# Patient Record
Sex: Female | Born: 1980 | Hispanic: No | Smoking: Never smoker
Health system: Southern US, Community
[De-identification: ages and names within clinical notes are randomized; demographics above are authoritative.]

---

## 2002-06-20 ENCOUNTER — Other Ambulatory Visit: Admission: RE | Admit: 2002-06-20 | Discharge: 2002-06-20 | Payer: Self-pay | Admitting: *Deleted

## 2003-06-21 ENCOUNTER — Other Ambulatory Visit: Admission: RE | Admit: 2003-06-21 | Discharge: 2003-06-21 | Payer: Self-pay | Admitting: *Deleted

## 2004-07-04 ENCOUNTER — Other Ambulatory Visit: Admission: RE | Admit: 2004-07-04 | Discharge: 2004-07-04 | Payer: Self-pay | Admitting: *Deleted

## 2018-01-23 ENCOUNTER — Emergency Department
Admission: EM | Admit: 2018-01-23 | Discharge: 2018-01-23 | Disposition: A | Discharge status: Home / Self Care | Payer: Self-pay | Source: Home / Self Care

## 2018-01-23 ENCOUNTER — Encounter: Payer: Self-pay | Admitting: Family Medicine

## 2018-01-23 ENCOUNTER — Other Ambulatory Visit: Payer: Self-pay

## 2018-01-23 ENCOUNTER — Emergency Department (INDEPENDENT_AMBULATORY_CARE_PROVIDER_SITE_OTHER): Payer: Self-pay

## 2018-01-23 DIAGNOSIS — J0100 Acute maxillary sinusitis, unspecified: Secondary | ICD-10-CM

## 2018-01-23 DIAGNOSIS — J101 Influenza due to other identified influenza virus with other respiratory manifestations: Secondary | ICD-10-CM

## 2018-01-23 DIAGNOSIS — R062 Wheezing: Secondary | ICD-10-CM

## 2018-01-23 DIAGNOSIS — R0602 Shortness of breath: Secondary | ICD-10-CM

## 2018-01-23 DIAGNOSIS — J4541 Moderate persistent asthma with (acute) exacerbation: Secondary | ICD-10-CM

## 2018-01-23 DIAGNOSIS — J40 Bronchitis, not specified as acute or chronic: Secondary | ICD-10-CM

## 2018-01-23 LAB — POCT INFLUENZA A/B
Influenza A, POC: POSITIVE — AB
Influenza B, POC: NEGATIVE

## 2018-01-23 MED ORDER — PREDNISONE 20 MG PO TABS: ORAL_TABLET | ORAL | 1 refills | Status: AC

## 2018-01-23 MED ORDER — OFLOXACIN 400 MG PO TABS: 400.0000 mg | ORAL_TABLET | Freq: Every day | ORAL | 0 refills | Status: AC

## 2018-01-23 MED ORDER — OSELTAMIVIR PHOSPHATE 75 MG PO CAPS: 75.0000 mg | ORAL_CAPSULE | Freq: Two times a day (BID) | ORAL | 0 refills | Status: AC

## 2018-01-23 NOTE — ED Triage Notes (Signed)
Here with worsening URI x1 week. Was seen at PCP office last week for sinus/uri and prescribed Amoxicillin. Not working. Worsening cough; chest tightness, fever 101.0 last night and body aches. Taking otc Mucinex D

## 2018-01-23 NOTE — Discharge Instructions (Addendum)
You should be seeing significant improvement in 2 days.  Let us know if not.

## 2018-01-23 NOTE — ED Provider Notes (Signed)
[facility]    CSN: [id number] Arrival date & time: 01/23/18  1107     History   Chief Complaint Chief Complaint  Patient presents with  . URI    worsening to chest     HPI Cynthia Franco is a 37 y.o. female.   This is a 37 year old woman is [facility]  She is complaining of flulike symptoms over the past 24 hours.  She initially has had facial pain and was treated with Augmentin for the last 5 days.  Facial pain has not changed but over the last 24 hours patient has developed Reiger's and increasing cough.  She is from out of town.  Patient past medical history significant for asthma and anemia.  She has been treated with iron and B12 for the anemia, and she [place] a rescue inhaler [place]     History reviewed. No pertinent past medical history.  There are no active problems to display for this patient.   History reviewed. No pertinent surgical history.  OB History   No obstetric history on file.      Home Medications    Prior to Admission medications   Medication Sig Start Date End Date Taking? Authorizing Provider  thyroid (ARMOUR) 240 MG tablet Take 240 mg by mouth daily.   Yes [provider]  ofloxacin (FLOXIN) 400 MG tablet Take 1 tablet (400 mg total) by mouth daily. 01/23/18   Elvina SidleLauenstein, Yazmina Pareja, MD  oseltamivir (TAMIFLU) 75 MG capsule Take 1 capsule (75 mg total) by mouth every 12 (twelve) hours. 01/23/18   Elvina SidleLauenstein, Brayleigh Rybacki, MD  predniSONE (DELTASONE) 20 MG tablet 2 daily with food 01/23/18   Elvina SidleLauenstein, Breeze Berringer, MD    Family History History reviewed. No pertinent family history.  Social History Social History   Tobacco Use  . Smoking status: Never Smoker  . Smokeless tobacco: Never Used  Substance Use Topics  . Alcohol use: Not on file  . Drug use: Not on file     Allergies   Patient has no known allergies.   Review of  Systems Review of Systems   Physical Exam Triage Vital Signs ED Triage Vitals  Enc Vitals Group     BP      Pulse      Resp      Temp      Temp src      SpO2      Weight      Height      Head Circumference      Peak Flow      Pain Score      Pain Loc      Pain Edu?      Excl. in GC?    No data found.  Updated Vital Signs BP 128/85 (BP Location: Left Arm)   Pulse (!) 102   Temp 98 F (36.7 C) (Oral)   Ht 5\' 3"  (1.6 m)   Wt 98 kg   SpO2 98%   BMI 38.26 kg/m   Physical Exam Vitals signs and nursing note reviewed.  Constitutional:      Appearance: Normal appearance. She is obese.  HENT:     Head: Normocephalic.     Right Ear: Tympanic membrane, ear canal and external ear normal.     Left Ear: Tympanic membrane, ear canal and external ear normal.     Nose: Congestion present.  Mouth/Throat:     Mouth: Mucous membranes are moist.     Pharynx: Posterior oropharyngeal erythema present.  Eyes:     Extraocular Movements: Extraocular movements intact.     Conjunctiva/sclera: Conjunctivae normal.     Pupils: Pupils are equal, round, and reactive to light.  Neck:     Musculoskeletal: Normal range of motion and neck supple.  Cardiovascular:     Rate and Rhythm: Normal rate.     Heart sounds: Normal heart sounds.  Pulmonary:     Effort: Pulmonary effort is normal.     Breath sounds: Wheezing present.     Comments: Wheezes are worse on the left side posteriorly.  She has both inspiratory and expiratory wheezes. Musculoskeletal: Normal range of motion.  Skin:    General: Skin is warm and dry.  Neurological:     General: No focal deficit present.     Mental Status: She is alert and oriented to person, place, and time.  Psychiatric:        Mood and Affect: Mood normal.        Behavior: Behavior normal.        Thought Content: Thought content normal.        Judgment: Judgment normal.      UC Treatments / Results  Labs (all labs ordered are listed, but only  abnormal results are displayed) Labs Reviewed  POCT INFLUENZA A/B - Abnormal; Notable for the following components:      Result Value   Influenza A, POC Positive (*)    All other components within normal limits  POCT INFLUENZA A/B    EKG None  Radiology Dg Chest 2 View  Result Date: 01/23/2018 CLINICAL DATA:  Patient with wheezing, shortness of breath. EXAM: CHEST - 2 VIEW COMPARISON:  None. FINDINGS: Normal cardiac and mediastinal contours. No consolidative pulmonary opacities. No pleural effusion or pneumothorax. IMPRESSION: No acute cardiopulmonary process. Electronically Signed   By: Annia Beltrew  Davis M.D.   On: 01/23/2018 12:05    Procedures Procedures (including critical care time)  Medications Ordered in UC Medications - No data to display  Initial Impression / Assessment and Plan / UC Course  I have reviewed the triage vital signs and the nursing notes.  Pertinent labs & imaging results that were available during my care of the patient were reviewed by me and considered in my medical decision making (see chart for details).    Final Clinical Impressions(s) / UC Diagnoses   Final diagnoses:  Influenza A  Acute maxillary sinusitis, recurrence not specified  Moderate persistent asthma with acute exacerbation  Bronchitis     Discharge Instructions     You should be seeing significant improvement in 2 days.  Let us know if not.    ED Prescriptions    Medication Sig Dispense Auth. Provider   oseltamivir (TAMIFLU) 75 MG capsule Take 1 capsule (75 mg total) by mouth every 12 (twelve) hours. 10 capsule Elvina SidleLauenstein, Denyla Cortese, MD   predniSONE (DELTASONE) 20 MG tablet 2 daily with food 10 tablet Elvina SidleLauenstein, Varick Keys, MD   ofloxacin (FLOXIN) 400 MG tablet Take 1 tablet (400 mg total) by mouth daily. 7 tablet Elvina SidleLauenstein, Jabree Rebert, MD     Controlled Substance Prescriptions [place] Controlled Substance Registry consulted? Not Applicable   Elvina SidleLauenstein, Darnise Montag, MD 01/23/18 1213

## 2020-02-28 IMAGING — DX DG CHEST 2V
2 series · 2 of 2 positions shown · non-contrast
Comparison: None.

CLINICAL DATA: Patient with wheezing, shortness of breath.

EXAM:
CHEST - 2 VIEW

[chest pa]
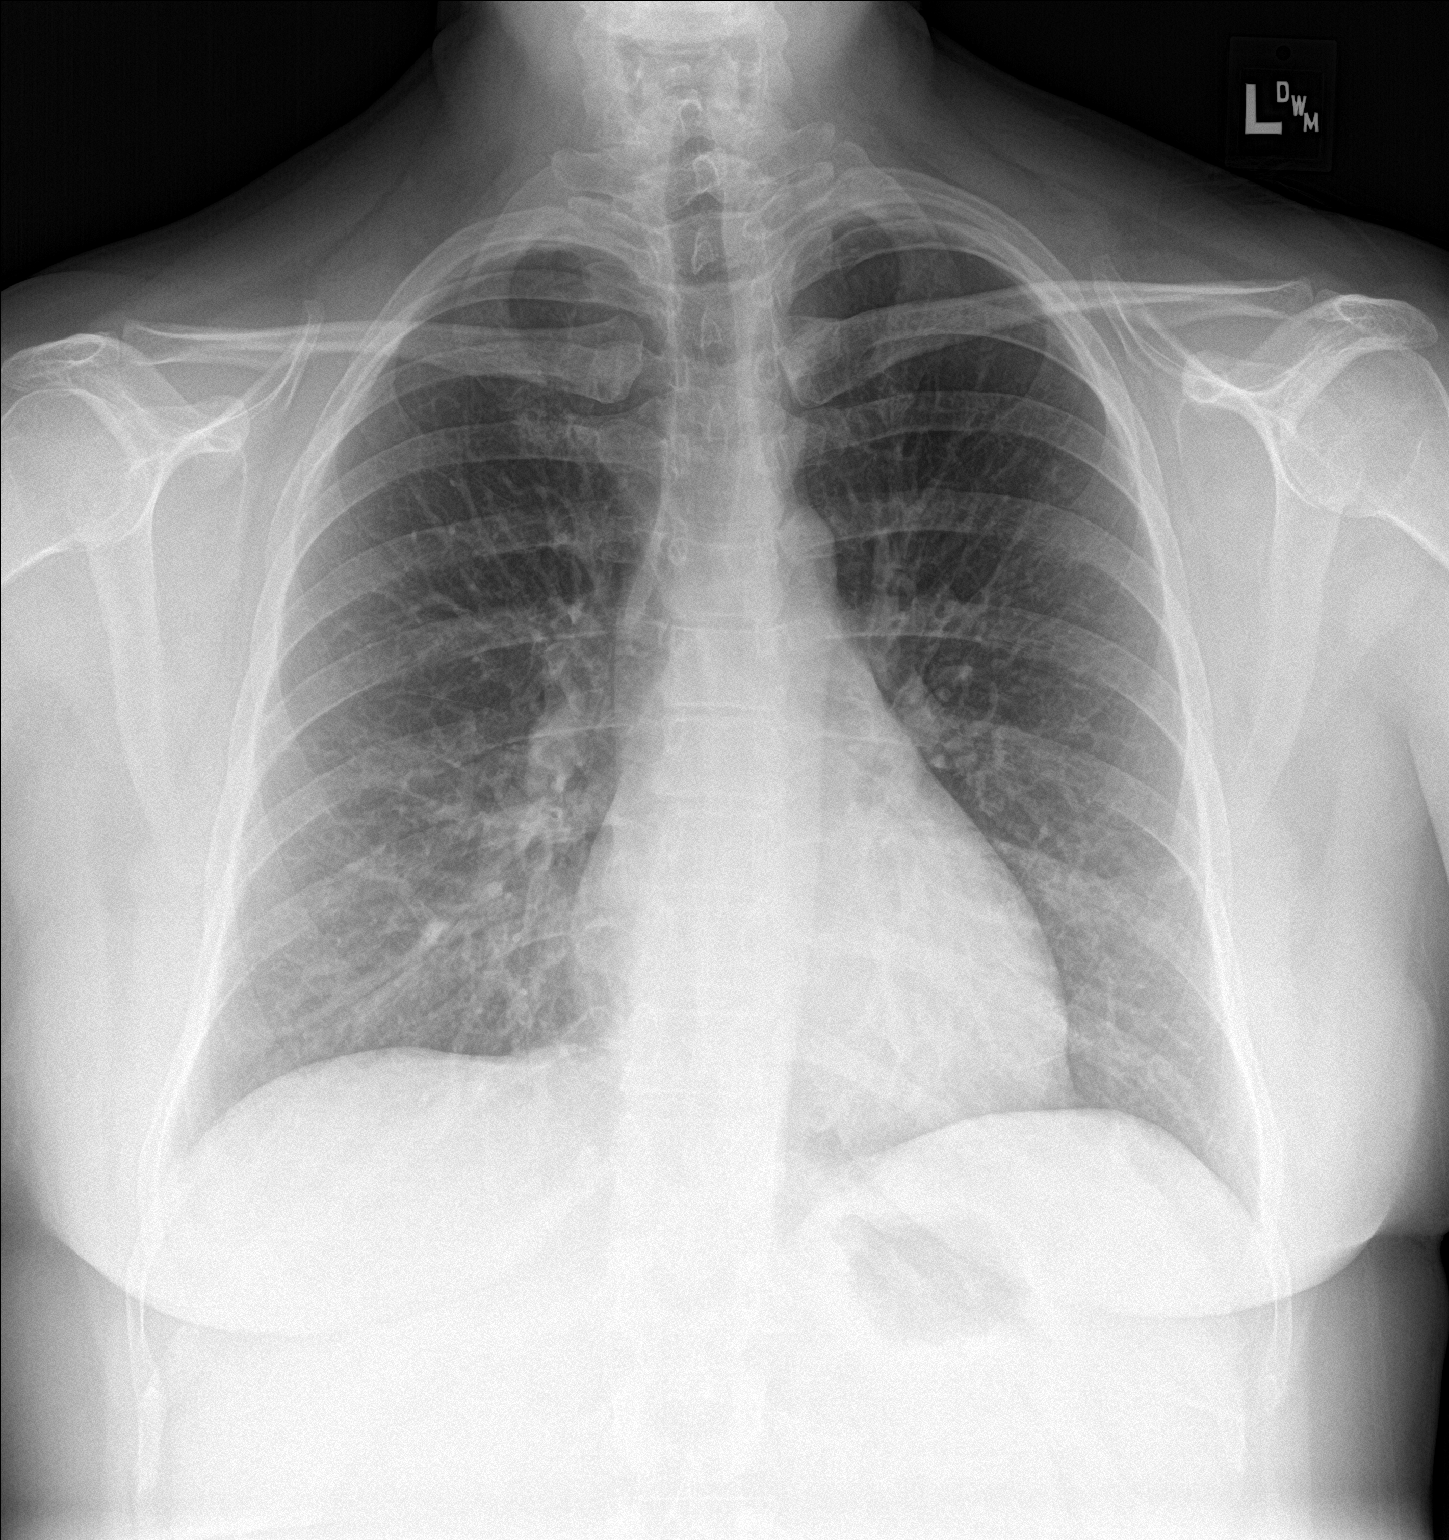

[chest lat]
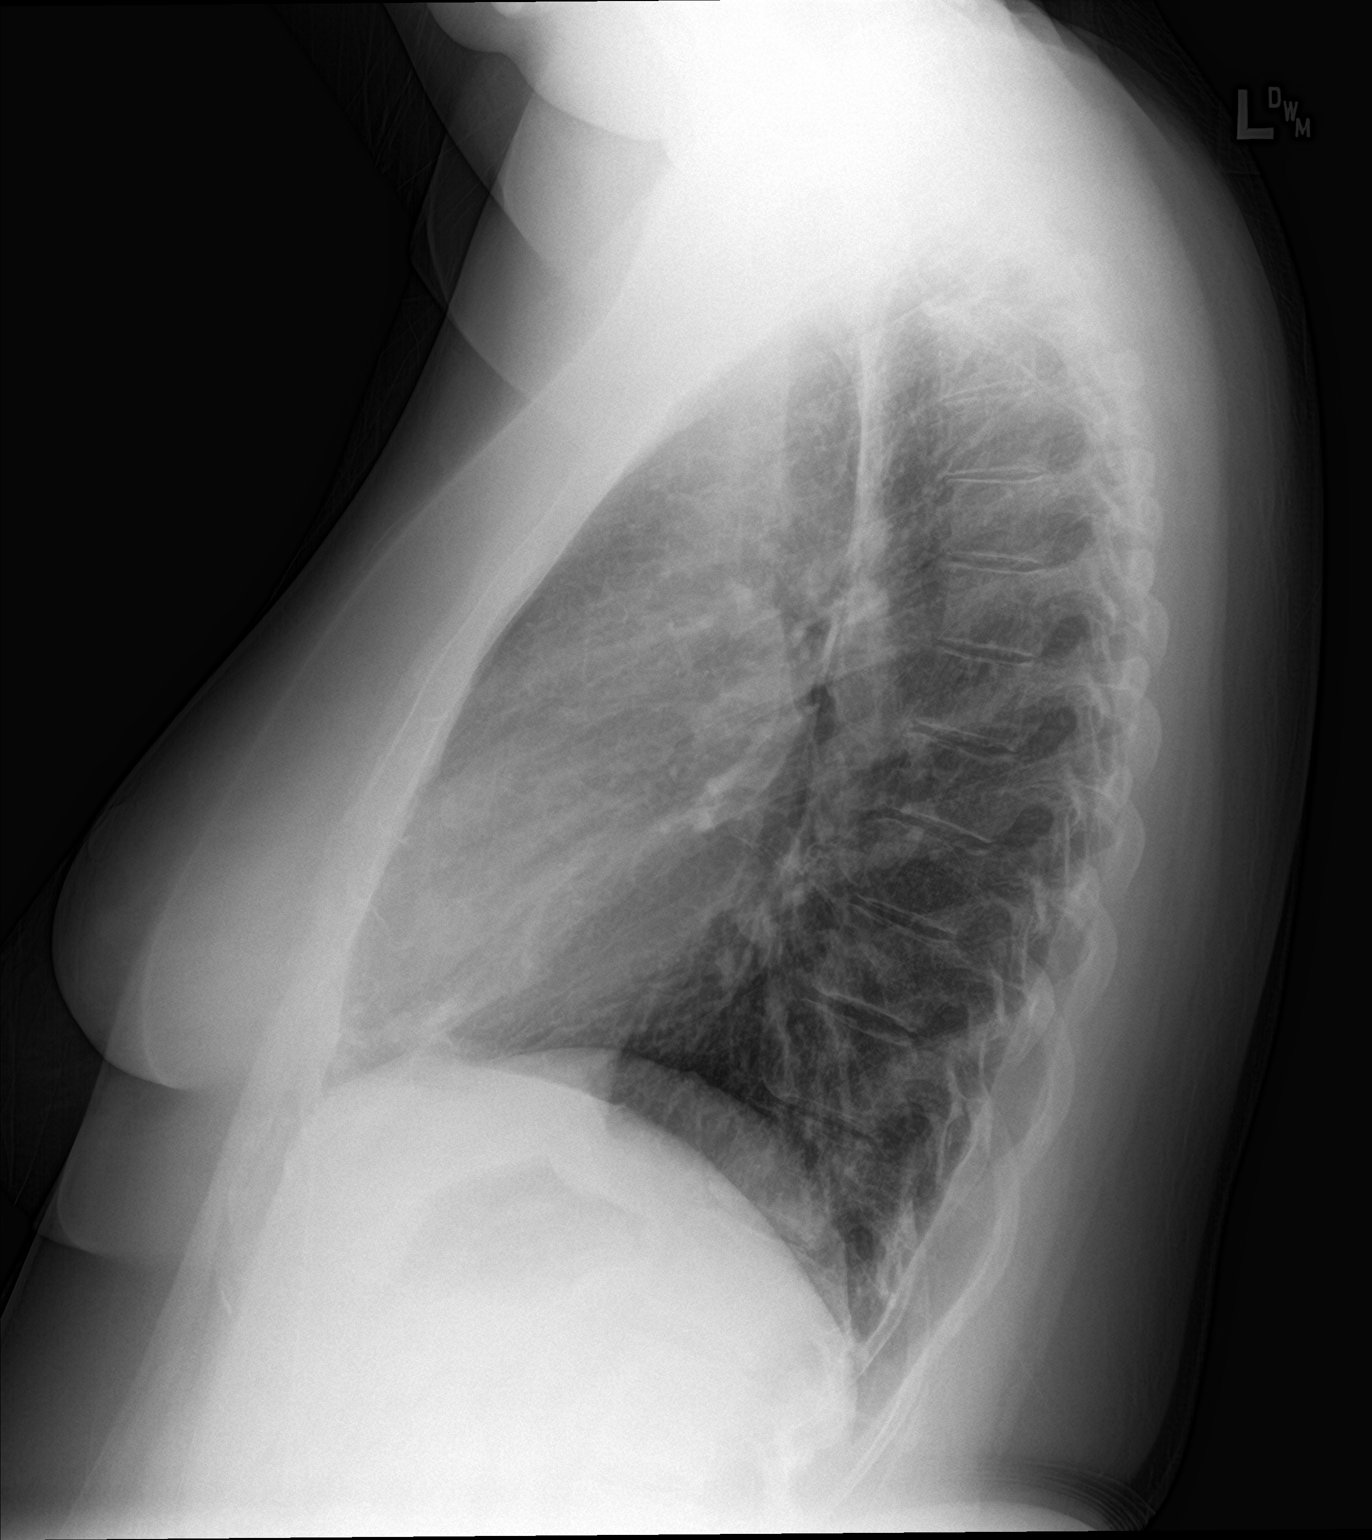

[2 of 2 positions shown; findings below may reference images not displayed]

FINDINGS: Normal cardiac and mediastinal contours. No consolidative pulmonary
opacities. No pleural effusion or pneumothorax.
IMPRESSION: No acute cardiopulmonary process.
